# Patient Record
Sex: Male | Born: 2005 | Hispanic: Yes | Marital: Single | Smoking: Never smoker
Health system: Southern US, Community
[De-identification: ages and names within clinical notes are randomized; demographics above are authoritative.]

## PROBLEM LIST (undated history)

## (undated) HISTORY — PX: TONSILLECTOMY: SUR1361

## (undated) HISTORY — PX: ADENOIDECTOMY: SUR15

---
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
## 2023-06-07 ENCOUNTER — Encounter (HOSPITAL_BASED_OUTPATIENT_CLINIC_OR_DEPARTMENT_OTHER): Payer: Self-pay | Admitting: Emergency Medicine

## 2023-06-07 ENCOUNTER — Other Ambulatory Visit: Payer: Self-pay

## 2023-06-07 ENCOUNTER — Emergency Department (HOSPITAL_BASED_OUTPATIENT_CLINIC_OR_DEPARTMENT_OTHER)
Admission: EM | Admit: 2023-06-07 | Discharge: 2023-06-07 | Disposition: A | Discharge status: Home / Self Care | Payer: BLUE CROSS/BLUE SHIELD | Attending: Emergency Medicine | Admitting: Emergency Medicine

## 2023-06-07 ENCOUNTER — Emergency Department (HOSPITAL_BASED_OUTPATIENT_CLINIC_OR_DEPARTMENT_OTHER): Payer: BLUE CROSS/BLUE SHIELD

## 2023-06-07 DIAGNOSIS — Y92410 Unspecified street and highway as the place of occurrence of the external cause: Secondary | ICD-10-CM | POA: Insufficient documentation

## 2023-06-07 DIAGNOSIS — M79651 Pain in right thigh: Secondary | ICD-10-CM | POA: Diagnosis not present

## 2023-06-07 DIAGNOSIS — M25531 Pain in right wrist: Secondary | ICD-10-CM | POA: Diagnosis not present

## 2023-06-07 DIAGNOSIS — M79632 Pain in left forearm: Secondary | ICD-10-CM | POA: Insufficient documentation

## 2023-06-07 MED ORDER — NAPROXEN 500 MG PO TABS: 500.0000 mg | ORAL_TABLET | Freq: Two times a day (BID) | ORAL | 0 refills | Status: AC

## 2023-06-07 MED ORDER — METHOCARBAMOL 500 MG PO TABS: 500.0000 mg | ORAL_TABLET | Freq: Two times a day (BID) | ORAL | 0 refills | Status: AC

## 2023-06-07 NOTE — Discharge Instructions (Addendum)
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
